# Patient Record
Sex: Female | Born: 2000 | Race: White | Hispanic: No | Marital: Single
Health system: Midwestern US, Community
[De-identification: ages and names within clinical notes are randomized; demographics above are authoritative.]

## PROBLEM LIST (undated history)

## (undated) DIAGNOSIS — L02212 Cutaneous abscess of back [any part, except buttock]: Secondary | ICD-10-CM

---
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
## 2018-03-16 ENCOUNTER — Ambulatory Visit (HOSPITAL_COMMUNITY)
Admission: EM | Admit: 2018-03-16 | Discharge: 2018-03-16 | Disposition: A | Discharge status: Home / Self Care | Payer: BLUE CROSS/BLUE SHIELD | Attending: Family Medicine | Admitting: Family Medicine

## 2018-03-16 ENCOUNTER — Encounter (HOSPITAL_COMMUNITY): Payer: Self-pay

## 2018-03-16 DIAGNOSIS — Z113 Encounter for screening for infections with a predominantly sexual mode of transmission: Secondary | ICD-10-CM

## 2018-03-16 DIAGNOSIS — Z202 Contact with and (suspected) exposure to infections with a predominantly sexual mode of transmission: Secondary | ICD-10-CM | POA: Diagnosis not present

## 2018-03-16 MED ORDER — AZITHROMYCIN 250 MG PO TABS
1000.0000 mg | ORAL_TABLET | Freq: Once | ORAL | Status: AC
  Administered 2018-03-16: 1000 mg via ORAL

## 2018-03-16 MED ORDER — AZITHROMYCIN 250 MG PO TABS
ORAL_TABLET | ORAL | Status: AC
  Filled 2018-03-16: qty 4

## 2018-03-16 MED ORDER — CEFTRIAXONE SODIUM 250 MG IJ SOLR
INTRAMUSCULAR | Status: AC
  Filled 2018-03-16: qty 250

## 2018-03-16 MED ORDER — CEFTRIAXONE SODIUM 250 MG IJ SOLR
250.0000 mg | Freq: Once | INTRAMUSCULAR | Status: AC
  Administered 2018-03-16: 250 mg via INTRAMUSCULAR

## 2018-03-16 NOTE — ED Triage Notes (Signed)
Pt presents with wanting to get STD Testing after an exposure of chylamydia from partner.

## 2018-03-16 NOTE — ED Notes (Signed)
Having Pt do a vaginal swab

## 2018-03-16 NOTE — Discharge Instructions (Signed)
We did lab testing during this visit.  If there are any abnormal findings that require change in medicine or indicate a positive result, you will be notified.  If all of your tests are normal, you will not be called.   Avoid sexual relations until 7 days after treatment

## 2018-03-16 NOTE — ED Provider Notes (Signed)
[facility]    CSN: [id number] Arrival date & time: 03/16/18  1144     History   Chief Complaint Chief Complaint  Patient presents with  . Exposure to STD    HPI Cynthia Savage is a 18 y.o. female.   HPI  Patient was notified by sexual partner that she has been exposed to chlamydia.  She would like to have STD testing and treatment. She is having no symptoms.  Regular menses.  Does not feel she is pregnant. No abdominal pain or fever or chills.  No rash  History reviewed. No pertinent past medical history.  There are no active problems to display for this patient.   History reviewed. No pertinent surgical history.  OB History   No obstetric history on file.      Home Medications    Prior to Admission medications   Not on File    Family History Family History  Problem Relation Age of Onset  . Healthy Mother   . Healthy Father     Social History Social History   Tobacco Use  . Smoking status: Current Every Day Smoker  . Smokeless tobacco: Never Used  Substance Use Topics  . Alcohol use: No    Frequency: Never  . Drug use: No     Allergies   Patient has no known allergies.   Review of Systems Review of Systems  Constitutional: Negative for chills and fever.  HENT: Negative for ear pain and sore throat.   Eyes: Negative for pain and visual disturbance.  Respiratory: Negative for cough and shortness of breath.   Cardiovascular: Negative for chest pain and palpitations.  Gastrointestinal: Negative for abdominal pain and vomiting.  Genitourinary: Negative for dysuria and hematuria.  Musculoskeletal: Negative for arthralgias and back pain.  Skin: Negative for color change and rash.  Neurological: Negative for seizures and syncope.  All other systems reviewed and are negative.    Physical Exam Triage Vital Signs ED Triage Vitals  Enc Vitals Group     BP 03/16/18 1241 123/85     Pulse Rate 03/16/18 1241 99     Resp 03/16/18 1241  18     Temp 03/16/18 1241 (!) 97.3 F (36.3 C)     Temp Source 03/16/18 1241 Oral     SpO2 03/16/18 1241 99 %     Weight --      Height --      Head Circumference --      Peak Flow --      Pain Score 03/16/18 1242 0     Pain Loc --      Pain Edu? --      Excl. in GC? --    No data found.  Updated Vital Signs BP 123/85 (BP Location: Right Arm)   Pulse 99   Temp (!) 97.3 F (36.3 C) (Oral)   Resp 18   LMP 03/09/2018   SpO2 99%   Visual Acuity Right Eye Distance:   Left Eye Distance:   Bilateral Distance:    Right Eye Near:   Left Eye Near:    Bilateral Near:     Physical Exam Constitutional:      General: She is not in acute distress.    Appearance: She is well-developed.  HENT:     Head: Normocephalic and atraumatic.  Eyes:     Conjunctiva/sclera: Conjunctivae normal.     Pupils: Pupils are equal, round, and reactive to light.  Neck:  Musculoskeletal: Normal range of motion.  Cardiovascular:     Rate and Rhythm: Normal rate.  Pulmonary:     Effort: Pulmonary effort is normal. No respiratory distress.  Abdominal:     General: There is no distension.     Palpations: Abdomen is soft.  Musculoskeletal: Normal range of motion.  Skin:    General: Skin is warm and dry.  Neurological:     General: No focal deficit present.     Mental Status: She is alert and oriented to person, place, and time.  Psychiatric:        Mood and Affect: Mood normal.        Behavior: Behavior normal.      UC Treatments / Results  Labs (all labs ordered are listed, but only abnormal results are displayed) Labs Reviewed  HIV ANTIBODY (ROUTINE TESTING W REFLEX)  RPR  CERVICOVAGINAL ANCILLARY ONLY    EKG None  Radiology No results found.  Procedures Procedures (including critical care time)  Medications Ordered in UC Medications  azithromycin (ZITHROMAX) tablet 1,000 mg (has no administration in time range)  cefTRIAXone (ROCEPHIN) injection 250 mg (has no  administration in time range)    Initial Impression / Assessment and Plan / UC Course  I have reviewed the triage vital signs and the nursing notes.  Pertinent labs & imaging results that were available during my care of the patient were reviewed by me and considered in my medical decision making (see chart for details).     Reviewed with patient's the importance of safe sex, infection prevention Final Clinical Impressions(s) / UC Diagnoses   Final diagnoses:  Exposure to STD     Discharge Instructions     We did lab testing during this visit.  If there are any abnormal findings that require change in medicine or indicate a positive result, you will be notified.  If all of your tests are normal, you will not be called.   Avoid sexual relations until 7 days after treatment    ED Prescriptions    None     Controlled Substance Prescriptions [place] Controlled Substance Registry consulted? Not Applicable   Eustace Moore, MD 03/16/18 1329

## 2018-03-17 LAB — HIV ANTIBODY (ROUTINE TESTING W REFLEX): HIV SCREEN 4TH GENERATION: NONREACTIVE

## 2018-03-17 LAB — RPR: RPR Ser Ql: NONREACTIVE

## 2018-03-18 LAB — CERVICOVAGINAL ANCILLARY ONLY
Chlamydia: POSITIVE — AB
Neisseria Gonorrhea: POSITIVE — AB
TRICH (WINDOWPATH): POSITIVE — AB

## 2018-03-19 ENCOUNTER — Telehealth (HOSPITAL_COMMUNITY): Payer: Self-pay | Admitting: Emergency Medicine

## 2018-03-19 MED ORDER — METRONIDAZOLE 500 MG PO TABS: 2000.0000 mg | ORAL_TABLET | Freq: Once | ORAL | 0 refills | Status: AC

## 2018-03-19 NOTE — Telephone Encounter (Signed)
Chlamydia is positive.  This was treated at the urgent care visit with po zithromax 1g.  Pt needs education to please refrain from sexual intercourse for 7 days to give the medicine time to work.  Sexual partners need to be notified and tested/treated.  Condoms may reduce risk of reinfection.  Recheck or followup with PCP for further evaluation if symptoms are not improving.  [facility] notified.  Test for gonorrhea was positive. This was treated at the urgent care visit with IM rocephin 250mg  and po zithromax 1g. Pt needs education to refrain from sexual intercourse for 7 days after treatment to give the medicine time to work. Sexual partners need to be notified and tested/treated. Condoms may reduce risk of reinfection. Recheck or followup with PCP for further evaluation if symptoms are not improving. [facility] notified.   Trichomonas is positive. Rx  for Flagyl 2 grams, once was sent to the pharmacy of record. Pt needs education to refrain from sexual intercourse for 7 days to give the medicine time to work. Sexual partners need to be notified and tested/treated. Condoms may reduce risk of reinfection. Recheck for further evaluation if symptoms are not improving.   Spoke with pt verbalized understanding

## 2018-04-26 ENCOUNTER — Ambulatory Visit (HOSPITAL_COMMUNITY)
Admission: EM | Admit: 2018-04-26 | Discharge: 2018-04-26 | Disposition: A | Discharge status: Home / Self Care | Payer: BLUE CROSS/BLUE SHIELD | Attending: Family Medicine | Admitting: Family Medicine

## 2018-04-26 ENCOUNTER — Encounter (HOSPITAL_COMMUNITY): Payer: Self-pay | Admitting: Emergency Medicine

## 2018-04-26 DIAGNOSIS — B009 Herpesviral infection, unspecified: Secondary | ICD-10-CM | POA: Diagnosis present

## 2018-04-26 MED ORDER — FLUCONAZOLE 150 MG PO TABS: 150.0000 mg | ORAL_TABLET | Freq: Once | ORAL | 0 refills | Status: AC

## 2018-04-26 MED ORDER — TRIAMCINOLONE ACETONIDE 0.1 % EX CREA: 1.0000 "application " | TOPICAL_CREAM | Freq: Two times a day (BID) | CUTANEOUS | 0 refills | Status: AC

## 2018-04-26 MED ORDER — VALACYCLOVIR HCL 1 G PO TABS: 1000.0000 mg | ORAL_TABLET | Freq: Three times a day (TID) | ORAL | 3 refills | Status: AC

## 2018-04-26 NOTE — ED Provider Notes (Signed)
[facility]    CSN: [id number] Arrival date & time: 04/26/18  [id number]     History   Chief Complaint Chief Complaint  Patient presents with  . SEXUALLY TRANSMITTED DISEASE    HPI Cynthia Savage is a 18 y.o. female.   18 yo established patient with recent visit for STD.  Pt c/o sore on her lower lip, pt also states she thinks she has herpes, pt states "I think there are lesions on it".   She had sex again with the same partner as last month.  That was Monday.  She developed sores on lips and perineum 2-3 days later.  Sex involved penetration and oral sex.  Patient works at [employer] and is very uncomfortable.     History reviewed. No pertinent past medical history.  There are no active problems to display for this patient.   History reviewed. No pertinent surgical history.  OB History   No obstetric history on file.      Home Medications    Prior to Admission medications   Medication Sig Start Date End Date Taking? Authorizing Provider  fluconazole (DIFLUCAN) 150 MG tablet Take 1 tablet (150 mg total) by mouth once for 1 dose. Repeat if needed 04/26/18 04/26/18  Elvina SidleLauenstein, Neidy Guerrieri, MD  triamcinolone cream (KENALOG) 0.1 % Apply 1 application topically 2 (two) times daily. 04/26/18   Elvina SidleLauenstein, Ayven Glasco, MD  valACYclovir (VALTREX) 1000 MG tablet Take 1 tablet (1,000 mg total) by mouth 3 (three) times daily for 14 days. 04/26/18 05/10/18  Elvina SidleLauenstein, Gracious Renken, MD    Family History Family History  Problem Relation Age of Onset  . Healthy Mother   . Healthy Father     Social History Social History   Tobacco Use  . Smoking status: Current Every Day Smoker  . Smokeless tobacco: Never Used  Substance Use Topics  . Alcohol use: No    Frequency: Never  . Drug use: No     Allergies   Patient has no known allergies.   Review of Systems Review of Systems  Skin: Positive for rash.  All other systems reviewed and are negative.    Physical Exam Triage Vital Signs  ED Triage Vitals  Enc Vitals Group     BP 04/26/18 0920 124/85     Pulse Rate 04/26/18 0920 (!) 111     Resp 04/26/18 0920 16     Temp 04/26/18 0920 98.3 F (36.8 C)     Temp src --      SpO2 04/26/18 0920 99 %     Weight --      Height --      Head Circumference --      Peak Flow --      Pain Score 04/26/18 0922 10     Pain Loc --      Pain Edu? --      Excl. in GC? --    No data found.  Updated Vital Signs BP 124/85   Pulse (!) 111   Temp 98.3 F (36.8 C)   Resp 16   LMP 04/12/2018   SpO2 99%    Physical Exam Vitals signs and nursing note reviewed.  Constitutional:      Appearance: Normal appearance.  HENT:     Mouth/Throat:     Comments: Cpld sore lower lip Pulmonary:     Effort: Pulmonary effort is normal.  Genitourinary:    Comments: Perineum has multiple watery vesicles Skin:    Findings: Lesion present.  Neurological:     Mental Status: She is alert.  Psychiatric:     Comments: tearful      UC Treatments / Results  Labs (all labs ordered are listed, but only abnormal results are displayed) Labs Reviewed  HSV CULTURE AND TYPING    EKG None  Radiology No results found.  Procedures Procedures (including critical care time)  Medications Ordered in UC Medications - No data to display  Initial Impression / Assessment and Plan / UC Course  I have reviewed the triage vital signs and the nursing notes.  Pertinent labs & imaging results that were available during my care of the patient were reviewed by me and considered in my medical decision making (see chart for details).    Final Clinical Impressions(s) / UC Diagnoses   Final diagnoses:  HSV infection     Discharge Instructions     The initial infection should be treated with Valtrex twice daily.  For any recurrence, twice daily for two days is sufficient  Aveeno oatmeal bath will help.  You can use the cream prescribed to lessen pain  Often HSV infections are accompanied by  a yeast infection, so I ordered a Diflucan (fluconazole) pill    ED Prescriptions    Medication Sig Dispense Auth. Provider   valACYclovir (VALTREX) 1000 MG tablet Take 1 tablet (1,000 mg total) by mouth 3 (three) times daily for 14 days. 14 tablet Elvina Sidle, MD   triamcinolone cream (KENALOG) 0.1 % Apply 1 application topically 2 (two) times daily. 30 g Elvina Sidle, MD   fluconazole (DIFLUCAN) 150 MG tablet Take 1 tablet (150 mg total) by mouth once for 1 dose. Repeat if needed 2 tablet Elvina Sidle, MD     Controlled Substance Prescriptions [place] Controlled Substance Registry consulted? Not Applicable   Elvina Sidle, MD 04/26/18 [phone]

## 2018-04-26 NOTE — Discharge Instructions (Addendum)
The initial infection should be treated with Valtrex twice daily.  For any recurrence, twice daily for two days is sufficient  Aveeno oatmeal bath will help.  You can use the cream prescribed to lessen pain  Often HSV infections are accompanied by a yeast infection, so I ordered a Diflucan (fluconazole) pill

## 2018-04-26 NOTE — ED Triage Notes (Signed)
Pt c/o sore on her lower lip, pt also states she thinks she has herpes, pt states "I think there are lesions on it".

## 2018-04-28 LAB — HSV CULTURE AND TYPING

## 2018-04-30 ENCOUNTER — Encounter (HOSPITAL_COMMUNITY): Payer: Self-pay

## 2018-04-30 ENCOUNTER — Telehealth (HOSPITAL_COMMUNITY): Payer: Self-pay | Admitting: Emergency Medicine

## 2018-04-30 NOTE — Telephone Encounter (Signed)
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 Attempted to reach patient. No answer at this time. No voicemail set up. Sent message in Mychart

## 2018-05-03 ENCOUNTER — Telehealth (HOSPITAL_COMMUNITY): Payer: Self-pay | Admitting: Emergency Medicine

## 2018-05-03 NOTE — Telephone Encounter (Signed)
Attempted to reach patient x2. No answer at this time. No voicemail set up.   

## 2018-05-05 ENCOUNTER — Telehealth (HOSPITAL_COMMUNITY): Payer: Self-pay | Admitting: Emergency Medicine

## 2018-05-05 NOTE — Telephone Encounter (Signed)
Patient contacted and made aware of all results, all questions answered.   

## 2019-04-01 IMAGING — CT CT HEAD W/O CM
4 series · 17 of 47 positions shown, 19 images · non-contrast
Comparison: None.

CLINICAL DATA: Headache with vomiting

EXAM:
CT HEAD WITHOUT CONTRAST
TECHNIQUE: Contiguous axial images were obtained from the base of the skull
through the vertex without intravenous contrast.

[Series 3: head wo · axial · 0.45mm/px · z∈[-201,-81]mm · 7 of 34 slices shown, 9 images]
[im 5/34  brain]
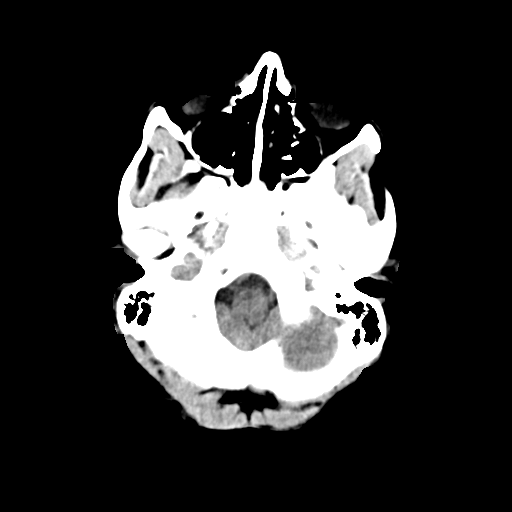
[im 5/34  bone]
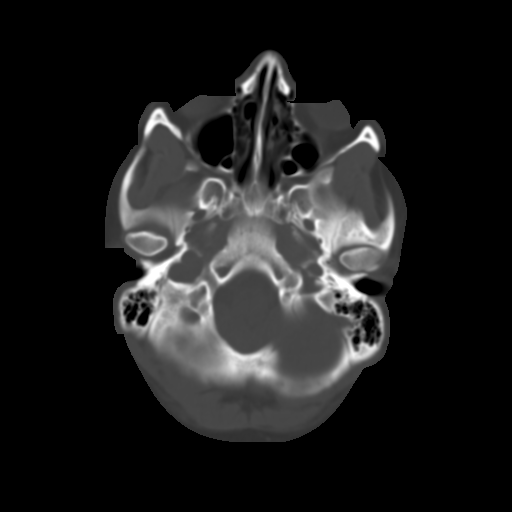
[im 9/34  brain]
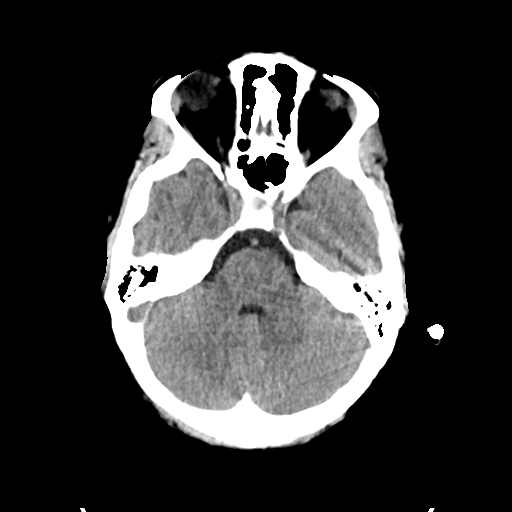
[im 13/34  brain]
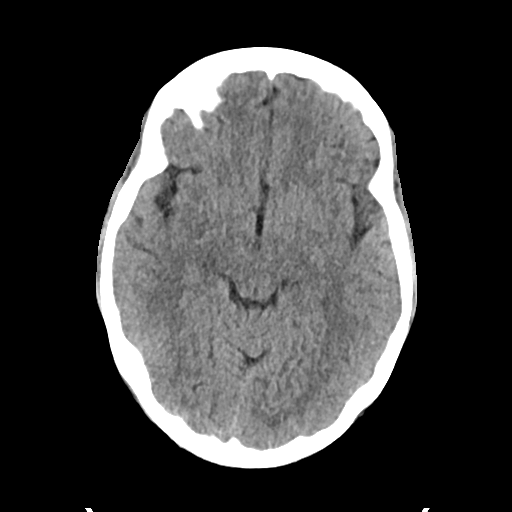
[im 17/34  brain]
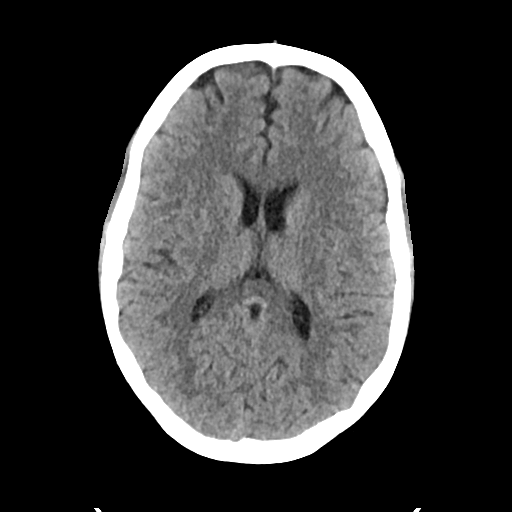
[im 21/34  brain]
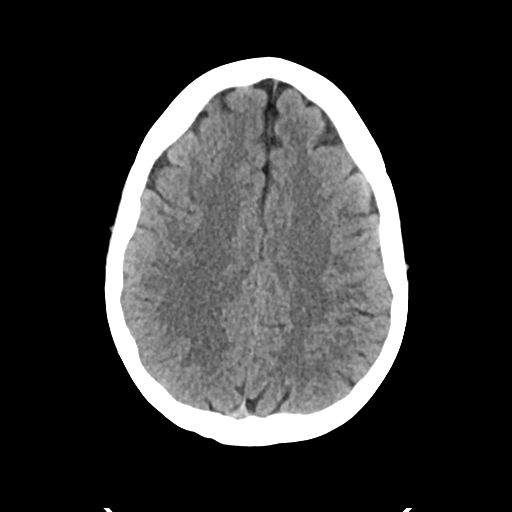
[im 21/34  bone]
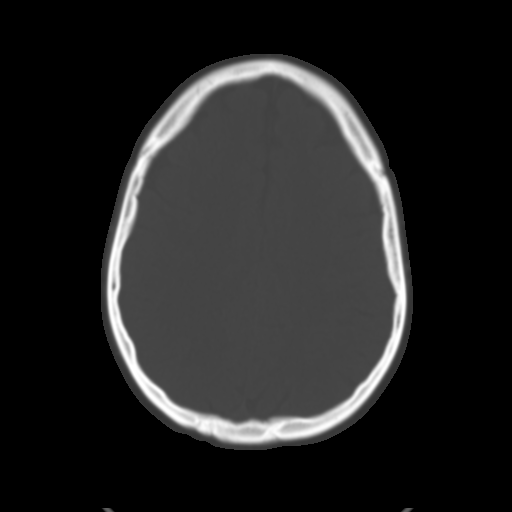
[im 25/34  brain]
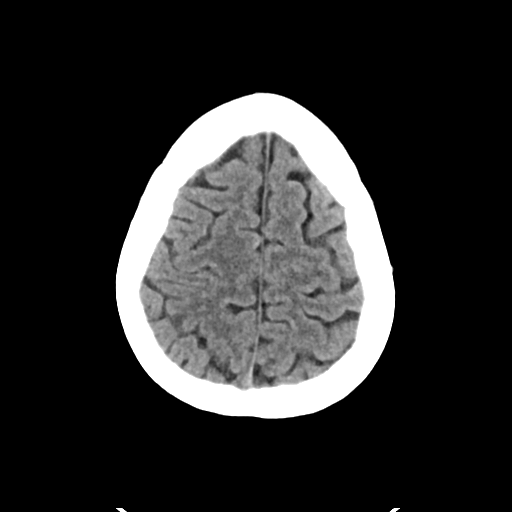
[im 29/34  brain]
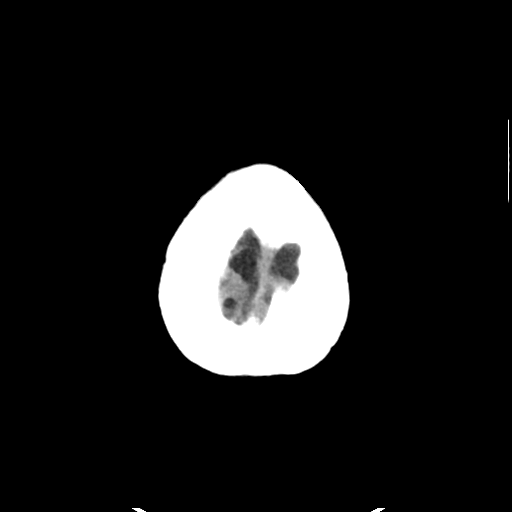

[Series 4: head bone · axial · 0.45mm/px · z∈[-205,-147]mm · 4 of 85 slices shown]
[im 9/85  bone]
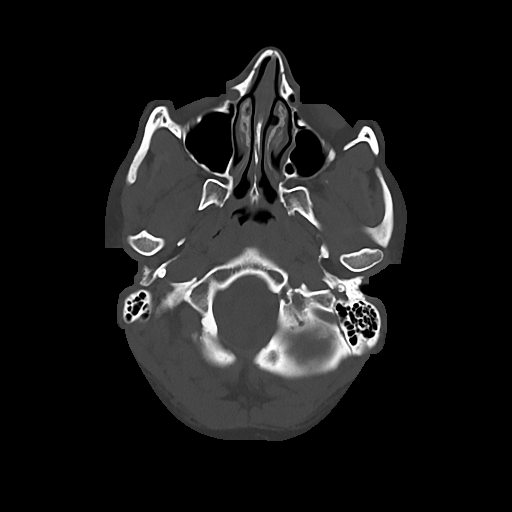
[im 17/85  bone]
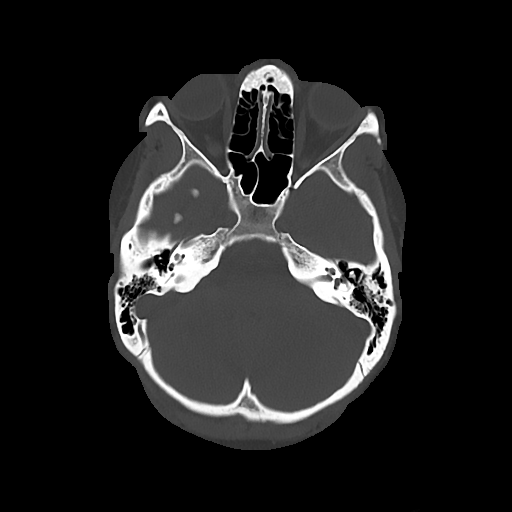
[im 26/85  bone]
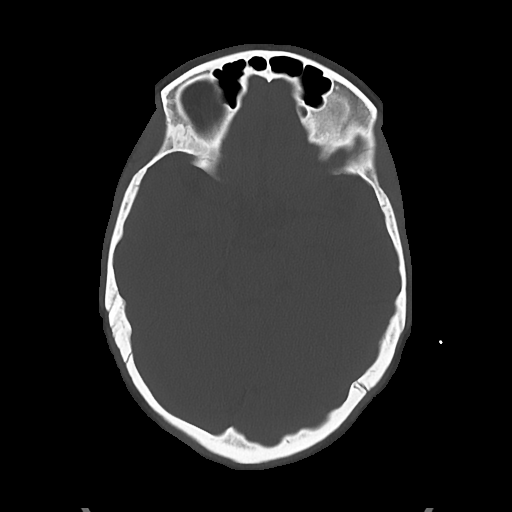
[im 38/85  bone]
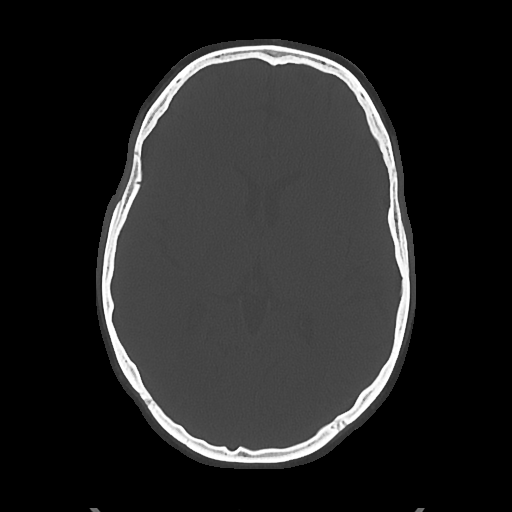

[Series 5: cor soft · coronal · 0.33mm/px · 3 of 70 slices shown]
[im 24/70  brain]
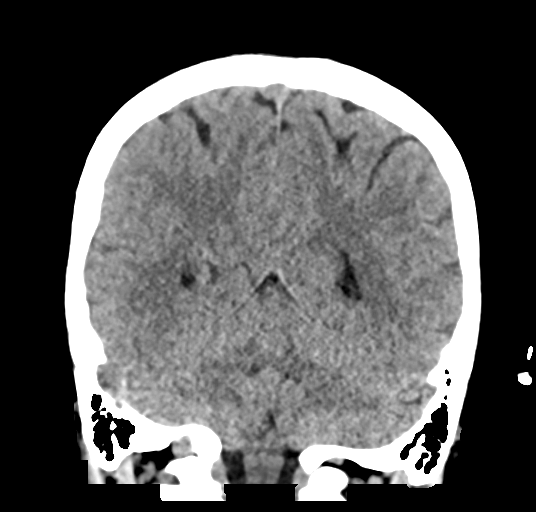
[im 31/70  brain]
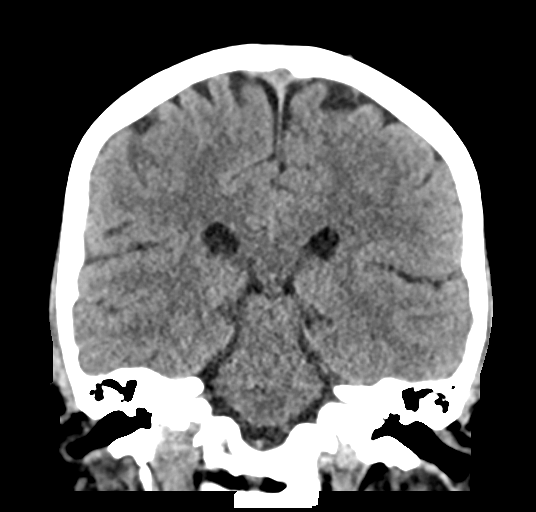
[im 39/70  brain]
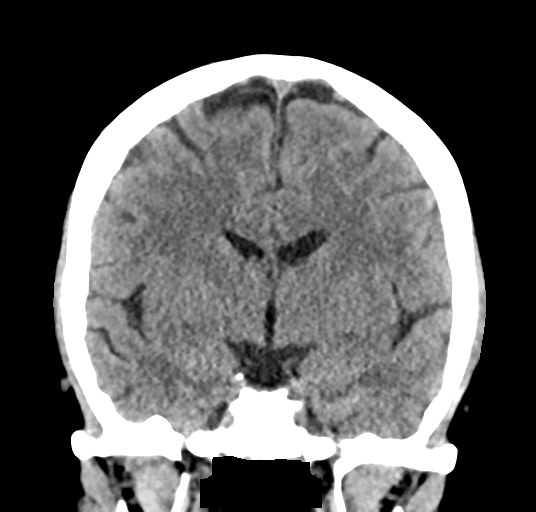

[Series 6: sag soft · sagittal · 0.33mm/px · 3 of 57 slices shown]
[im 19/57  brain]
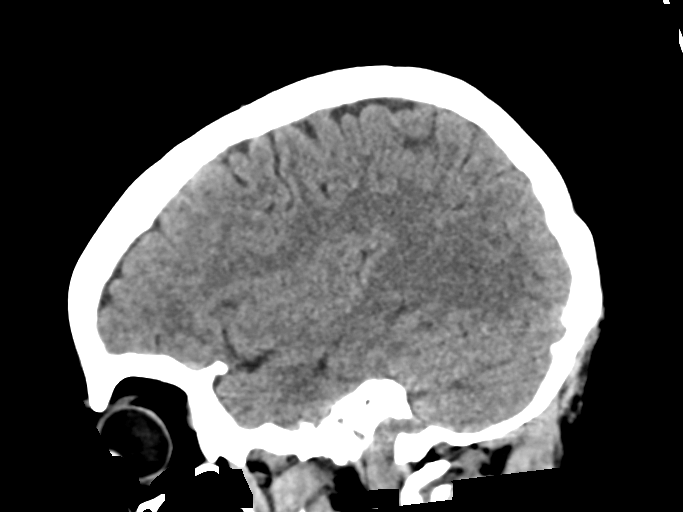
[im 29/57  brain]
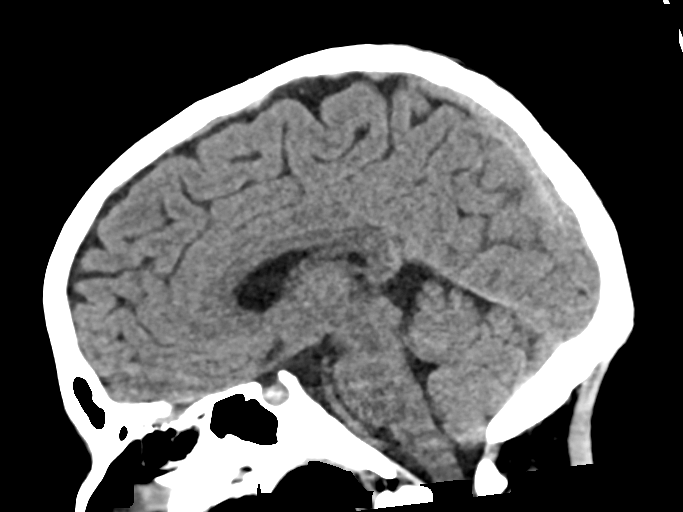
[im 38/57  brain]
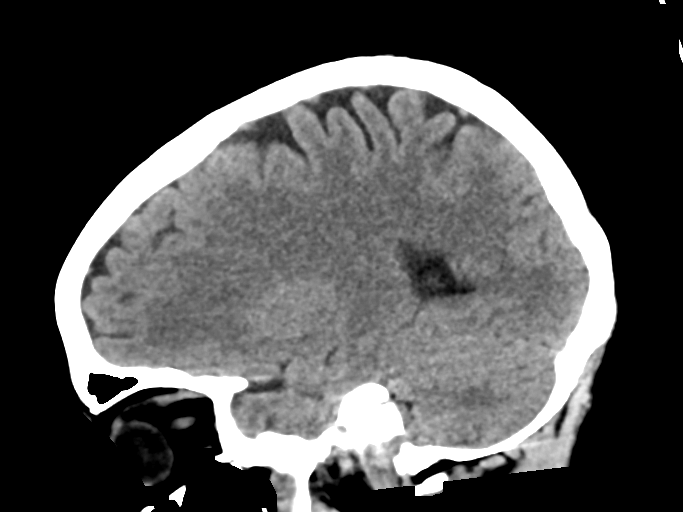

[17 of 47 positions shown; findings below may reference images not displayed]

FINDINGS: Brain: No evidence of acute infarction, hemorrhage, hydrocephalus,
extra-axial collection or mass lesion/mass effect.

Vascular: No hyperdense vessel or unexpected calcification.

Skull: Normal. Negative for fracture or focal lesion.

Sinuses/Orbits: No acute finding.

Other: None
IMPRESSION: Negative non contrasted CT appearance of the brain

## 2022-05-14 ENCOUNTER — Inpatient Hospital Stay
Admit: 2022-05-14 | Discharge: 2022-05-15 | Disposition: A | Discharge status: Home / Self Care | Payer: PRIVATE HEALTH INSURANCE | Attending: Emergency Medicine

## 2022-05-14 DIAGNOSIS — Z0000 Encounter for general adult medical examination without abnormal findings: Secondary | ICD-10-CM

## 2022-05-14 NOTE — ED Triage Notes (Signed)
Pt thinks that she might have a tampon left in her body. It might have happened on Sunday. She was still using tampons on Monday but she is unsure. And when she inserts a tampons in now only the tip gets bloody and she usually has heavy periods. They tried to assess themselves but they could not find anything and she just wants to be sure.

## 2022-05-14 NOTE — ED Provider Notes (Signed)
[facility]  EMERGENCY DEPARTMENT HISTORY AND PHYSICAL EXAM      Date: 05/14/2022  Patient Name: Toni Davies  MRN: [id number]  Birthdate 01-Oct-2000  Date of evaluation: 05/14/2022  Provider: Fredrich Romans, MD   Note Started: 8:35 PM EDT 05/14/22    HISTORY OF PRESENT ILLNESS     Chief Complaint   Patient presents with    Foreign Body       History Provided By: Patient    HPI: Toni Davies is a 22 y.o. female who says that she does not remember if she took out a tampon 3 days ago.  Patient states that she did not remember taking it out but now cannot find it.  She is unclear if she has a retained foreign body.  She denies history of this in the past.  She denies any signs symptoms fever or discharge.    PAST MEDICAL HISTORY   Past Medical History:  History reviewed. No pertinent past medical history.    Past Surgical History:  Past Surgical History:   Procedure Laterality Date    ADENOIDECTOMY      TONSILLECTOMY         Family History:  History reviewed. No pertinent family history.    Social History:  Social History     Tobacco Use    Smoking status: Every Day     Types: Cigarettes, Cigars    Smokeless tobacco: Never   Vaping Use    Vaping Use: Some days   Substance Use Topics    Alcohol use: Yes    Drug use: Yes     Types: Marijuana Sheran Fava)       Allergies:  No Known Allergies    PCP: No primary care provider on file.    Current Meds:   No current facility-administered medications for this encounter.     No current outpatient medications on file.       Social Determinants of Health:   Social Determinants of Health     Tobacco Use: High Risk (05/14/2022)    Patient History     Smoking Tobacco Use: Every Day     Smokeless Tobacco Use: Never     Passive Exposure: Not on file   Alcohol Use: Not on file   Financial Resource Strain: Not on file   Food Insecurity: Not on file   Transportation Needs: Not on file   Physical Activity: Not on file   Stress: Not on file   Social Connections: Not on file   Intimate Partner  Violence: Not on file   Depression: Not on file   Housing Stability: Not on file   Interpersonal Safety: Not on file   Utilities: Not on file       PHYSICAL EXAM   Physical Exam  Constitutional:       Appearance: Normal appearance.   HENT:      Head: Normocephalic and atraumatic.      Right Ear: External ear normal.      Left Ear: External ear normal.      Nose: Nose normal.      Mouth/Throat:      Mouth: Mucous membranes are moist.   Eyes:      Extraocular Movements: Extraocular movements intact.      Conjunctiva/sclera: Conjunctivae normal.      Pupils: Pupils are equal, round, and reactive to light.   Cardiovascular:      Rate and Rhythm: Normal rate and regular rhythm.  Pulses: Normal pulses.      Heart sounds: Normal heart sounds.   Pulmonary:      Effort: Pulmonary effort is normal.      Breath sounds: Normal breath sounds.   Abdominal:      General: Abdomen is flat. Bowel sounds are normal.      Palpations: Abdomen is soft.   Genitourinary:     General: Normal vulva.      Vagina: No vaginal discharge.      Rectum: Guaiac result negative.   Musculoskeletal:         General: No swelling, tenderness or signs of injury. Normal range of motion.      Cervical back: Normal range of motion and neck supple.   Skin:     General: Skin is warm and dry.      Capillary Refill: Capillary refill takes less than 2 seconds.   Neurological:      General: No focal deficit present.      Mental Status: She is alert and oriented to person, place, and time.   Psychiatric:         Mood and Affect: Mood normal.         Behavior: Behavior normal.         Thought Content: Thought content normal.         Judgment: Judgment normal.           SCREENINGS                   LAB, EKG AND DIAGNOSTIC RESULTS   Labs:  No results found for this or any previous visit (from the past 12 hour(s)).    EKG:.Not Applicable    Radiologic Studies:  Non-plain film images such as CT, Ultrasound and MRI are read by the radiologist. Plain radiographic  images are visualized and preliminarily interpreted by the ED Provider with the following findings: Not Applicable.    Interpretation per the Radiologist below, if available at the time of this note:  No orders to display        ED COURSE and DIFFERENTIAL DIAGNOSIS/MDM   8:36 PM Differential and Considerations: Pelvic exam does not reveal any foreign body at this time    Records Reviewed (source and summary of external notes): Prior medical records and Nursing notes.    Vitals:    Vitals:    05/14/22 1947   BP: (!) 141/103   Pulse: (!) 102   Temp: 98.1 F (36.7 C)   SpO2: 98%        ED COURSE       SEPSIS Reassessment: Sepsis reassessment not applicable    Clinical Management Tools:      Patient was given the following medications:  Medications - No data to display    CONSULTS: See ED Course/MDM for further details.  None     Social Determinants affecting Diagnosis/Treatment: None    Smoking Cessation: Not Applicable    PROCEDURES   Unless otherwise noted above, none  Procedures      CRITICAL CARE TIME   Patient does not meet Critical Care Time, 0 minutes    ED IMPRESSION     1. Encounter for well adult exam without abnormal findings          DISPOSITION/PLAN   DISPOSITION Decision To Discharge 05/14/2022 08:35:09 PM    Discharge Note: The patient is stable for discharge home. The signs, symptoms, diagnosis, and discharge instructions have been discussed, understanding conveyed, and agreed  upon. The patient is to follow up as recommended or return to ER should their symptoms worsen.      PATIENT REFERRED TO:  [facility] EMERGENCY DEPT  [address]  [place] [address]  [phone]  Call   As needed, If symptoms worsen        DISCHARGE MEDICATIONS:     Medication List      You have not been prescribed any medications.           DISCONTINUED MEDICATIONS:  There are no discharge medications for this patient.      I am the Primary Clinician of Record. Fredrich Romans, MD (electronically signed)    (Please note that  parts of this dictation were completed with voice recognition software. Quite often unanticipated grammatical, syntax, homophones, and other interpretive errors are inadvertently transcribed by the computer software. Please disregards these errors. Please excuse any errors that have escaped final proofreading.)     Haze Justin, MD  05/14/22 2036

## 2022-05-14 NOTE — ED Notes (Signed)
Discharged home to self.  Ambulatory out of ED.  VS WDL.  0 s/s acute distress.  Respirations even and unlabored.  Discharge instructions and follow up care reviewed.  Patient receptive and demonstrated knowledge of instruction via teach-back method.

## 2022-07-21 ENCOUNTER — Inpatient Hospital Stay
Admit: 2022-07-21 | Discharge: 2022-07-22 | Disposition: A | Discharge status: Home / Self Care | Payer: PRIVATE HEALTH INSURANCE | Attending: Emergency Medicine

## 2022-07-21 DIAGNOSIS — L02212 Cutaneous abscess of back [any part, except buttock]: Secondary | ICD-10-CM

## 2022-07-21 NOTE — ED Notes (Signed)
[facility]  EMERGENCY DEPARTMENT ENCOUNTER      Pt Name: Toni Davies  MRN: [id number]  Birthdate Aug 07, 2000  Date of evaluation: 07/21/2022  Provider: Belia Heman. Brittney Caraway, MD  8:09 PM    CHIEF COMPLAINT       Chief Complaint   Patient presents with    Insect Bite         HISTORY OF PRESENT ILLNESS    Toni Davies is a 22 y.o. female who presents to the emergency department possible insect bite on the right flank that she noticed yesterday.  She denies itching.  She reports mild local pain.    HPI    Nursing Notes were reviewed.    REVIEW OF SYSTEMS       Review of Systems   Constitutional: Negative.    HENT: Negative.     Cardiovascular: Negative.    Gastrointestinal: Negative.    Skin:  Positive for rash and wound.   Hematological: Negative.        Except as noted above the remainder of the review of systems was reviewed and negative.       PAST MEDICAL HISTORY   History reviewed. No pertinent past medical history.      SURGICAL HISTORY       Past Surgical History:   Procedure Laterality Date    ADENOIDECTOMY      TONSILLECTOMY           CURRENT MEDICATIONS       Previous Medications    No medications on file       ALLERGIES     Patient has no known allergies.    FAMILY HISTORY     History reviewed. No pertinent family history.       SOCIAL HISTORY       Social History     Socioeconomic History    Marital status: Single     Spouse name: None    Number of children: None    Years of education: None    Highest education level: None   Tobacco Use    Smoking status: Some Days     Types: Cigarettes, Cigars    Smokeless tobacco: Never   Vaping Use    Vaping Use: Former   Substance and Sexual Activity    Alcohol use: Yes     Comment: weekly    Drug use: Yes     Types: Marijuana (Weed)     Comment: some days    Sexual activity: Defer       SCREENINGS         Glasgow Coma Scale  Eye Opening: Spontaneous  Best Verbal Response: Oriented  Best Motor Response: Obeys commands  Glasgow Coma Scale Score: 15                     CIWA  Assessment  BP: (!) 147/105  Pulse: 87                 PHYSICAL EXAM       ED Triage Vitals [07/21/22 1953]   BP Temp Temp Source Pulse Respirations SpO2 Height Weight - Scale   (!) 147/105 98.1 F (36.7 C) Oral 87 18 98 % 1.549 m (5\' 1" ) 89.8 kg (198 lb)       Physical Exam  Vitals and nursing note reviewed.   Constitutional:       Appearance: Normal appearance.   HENT:      Head: Normocephalic.  Nose: Nose normal.      Mouth/Throat:      Pharynx: Oropharynx is clear.   Eyes:      Extraocular Movements: Extraocular movements intact.      Conjunctiva/sclera: Conjunctivae normal.      Pupils: Pupils are equal, round, and reactive to light.   Skin:     Capillary Refill: Capillary refill takes less than 2 seconds.      Findings: Lesion present.             Comments: 3 cm erythematous lesion with a pustule center   Neurological:      Mental Status: She is alert.         DIAGNOSTIC RESULTS     EKG: All EKG's are interpreted by the Emergency Department Physician who either signs or Co-signs this chart in the absence of a cardiologist.        RADIOLOGY:   Non-plain film images such as CT, Ultrasound and MRI are read by the radiologist. Plain radiographic images are visualized and preliminarily interpreted by the emergency physician with the below findings:        Interpretation per the Radiologist below, if available at the time of this note:    No orders to display         ED BEDSIDE ULTRASOUND:   Performed by ED Physician - none    LABS:  Labs Reviewed - No data to display    All other labs were within normal range or not returned as of this dictation.    EMERGENCY DEPARTMENT COURSE and DIFFERENTIAL DIAGNOSIS/MDM:   Vitals:    Vitals:    07/21/22 1953   BP: (!) 147/105   Pulse: 87   Resp: 18   Temp: 98.1 F (36.7 C)   TempSrc: Oral   SpO2: 98%   Weight: 89.8 kg (198 lb)   Height: 1.549 m (5\' 1" )           Medical Decision Making  22 year old female presents with an erythematous skin lesion.  There is a pustule in  the center.  It appears to be a follicular abscess.  I cleaned the wound with Betadine and removed the pustular center.  There is nothing to be drained.  Has the appearance of infected insect bite possibly.  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    Risk  Prescription drug management.            REASSESSMENT          CRITICAL CARE TIME   Total Critical Care time was  minutes, excluding separately reportable procedures.  There was a high probability of clinically significant/life threatening deterioration in the patient's condition which required my urgent intervention.      CONSULTS:  None    PROCEDURES:  Unless otherwise noted below, none     Procedures        FINAL IMPRESSION    No diagnosis found.      DISPOSITION/PLAN   DISPOSITION        PATIENT REFERRED TO:  No follow-up provider specified.    DISCHARGE MEDICATIONS:  New Prescriptions    No medications on file     Controlled Substances Monitoring:          No data to display                (Please note that portions of this note were completed with a voice recognition program.  Efforts were made to  edit the dictations but occasionally words are mis-transcribed.)    Belia Heman. Mehdi Gironda, MD (electronically signed)  Attending Emergency Physician           Garreth Burnsworth, Belia Heman, MD  07/21/22 [phone]

## 2022-07-21 NOTE — ED Notes (Signed)
D/C home, ambulatory, with friend and responsible driver. D/C instructions, ABT, wound care and follow up reviewed; understanding verbalized. A&Ox4 at this time.

## 2022-07-21 NOTE — ED Triage Notes (Signed)
Reports was bitten by an insect yesterday, unsure of what kind. Reddened, raised area with white center observed to right lower back; skin hot to touch. Also reports nausea and headache.

## 2022-07-22 MED ORDER — SULFAMETHOXAZOLE-TRIMETHOPRIM 800-160 MG PO TABS
800-160 MG | ORAL_TABLET | Freq: Two times a day (BID) | ORAL | 0 refills | Status: AC
Start: 2022-07-22 — End: 2022-07-28

## 2022-07-22 MED ORDER — SULFAMETHOXAZOLE-TRIMETHOPRIM 800-160 MG PO TABS
800-160 | ORAL | Status: AC
Start: 2022-07-22 — End: 2022-07-21
  Administered 2022-07-22: 1 via ORAL

## 2022-07-22 MED FILL — SULFAMETHOXAZOLE-TRIMETHOPRIM 800-160 MG PO TABS: 800-160 MG | ORAL | Qty: 1
